# Patient Record
Sex: Female | Born: 1960 | Hispanic: No | Marital: Married | Smoking: Never smoker
Health system: Southern US, Community
[De-identification: ages and names within clinical notes are randomized; demographics above are authoritative.]

## PROBLEM LIST (undated history)

## (undated) DIAGNOSIS — A159 Respiratory tuberculosis unspecified: Secondary | ICD-10-CM

## (undated) DIAGNOSIS — I959 Hypotension, unspecified: Secondary | ICD-10-CM

## (undated) HISTORY — DX: Respiratory tuberculosis unspecified: A15.9

---

## 2011-12-07 ENCOUNTER — Other Ambulatory Visit: Payer: Self-pay | Admitting: Specialist

## 2011-12-07 ENCOUNTER — Ambulatory Visit
Admission: RE | Admit: 2011-12-07 | Discharge: 2011-12-07 | Disposition: A | Discharge status: Home / Self Care | Payer: No Typology Code available for payment source | Source: Ambulatory Visit | Attending: Specialist | Admitting: Specialist

## 2011-12-07 DIAGNOSIS — R6889 Other general symptoms and signs: Secondary | ICD-10-CM

## 2011-12-07 DIAGNOSIS — R7612 Nonspecific reaction to cell mediated immunity measurement of gamma interferon antigen response without active tuberculosis: Secondary | ICD-10-CM

## 2015-01-17 ENCOUNTER — Emergency Department (INDEPENDENT_AMBULATORY_CARE_PROVIDER_SITE_OTHER)
Admission: EM | Admit: 2015-01-17 | Discharge: 2015-01-17 | Disposition: A | Discharge status: Home / Self Care | Payer: Self-pay | Source: Home / Self Care | Attending: Family Medicine | Admitting: Family Medicine

## 2015-01-17 ENCOUNTER — Encounter (HOSPITAL_COMMUNITY): Payer: Self-pay | Admitting: Emergency Medicine

## 2015-01-17 DIAGNOSIS — S29011A Strain of muscle and tendon of front wall of thorax, initial encounter: Secondary | ICD-10-CM

## 2015-01-17 DIAGNOSIS — R059 Cough, unspecified: Secondary | ICD-10-CM

## 2015-01-17 DIAGNOSIS — R05 Cough: Secondary | ICD-10-CM

## 2015-01-17 DIAGNOSIS — IMO0001 Reserved for inherently not codable concepts without codable children: Secondary | ICD-10-CM

## 2015-01-17 DIAGNOSIS — R030 Elevated blood-pressure reading, without diagnosis of hypertension: Secondary | ICD-10-CM

## 2015-01-17 HISTORY — DX: Hypotension, unspecified: I95.9

## 2015-01-17 LAB — POCT URINALYSIS DIP (DEVICE)
Bilirubin Urine: NEGATIVE
GLUCOSE, UA: NEGATIVE mg/dL
Ketones, ur: NEGATIVE mg/dL
LEUKOCYTES UA: NEGATIVE
NITRITE: NEGATIVE
PROTEIN: NEGATIVE mg/dL
SPECIFIC GRAVITY, URINE: 1.01 (ref 1.005–1.030)
UROBILINOGEN UA: 0.2 mg/dL (ref 0.0–1.0)
pH: 7 (ref 5.0–8.0)

## 2015-01-17 MED ORDER — TRAMADOL HCL 50 MG PO TABS: 50.0000 mg | ORAL_TABLET | Freq: Two times a day (BID) | ORAL | Status: AC | PRN

## 2015-01-17 MED ORDER — IBUPROFEN 600 MG PO TABS: 600.0000 mg | ORAL_TABLET | Freq: Three times a day (TID) | ORAL | Status: AC | PRN

## 2015-01-17 MED ORDER — IBUPROFEN 800 MG PO TABS
ORAL_TABLET | ORAL | Status: AC
  Filled 2015-01-17: qty 1

## 2015-01-17 MED ORDER — IBUPROFEN 800 MG PO TABS
800.0000 mg | ORAL_TABLET | Freq: Once | ORAL | Status: AC
  Administered 2015-01-17: 800 mg via ORAL

## 2015-01-17 NOTE — Discharge Instructions (Signed)
May use Claritin or Zyrtec as directed on packaging in stead of Allegra for allergy symptoms. Medications as prescribed for chest wall pain and cough suppression. Please review instructions below. You should also be aware that your blood pressure is elevated (167/94) and this should be addressed by the primary care doctor of your choice as soon as possible. If symptoms suddenly worse or severe, seek immediate re-evaluation at your nearest emergency room Cough, Adult  A cough is a reflex that helps clear your throat and airways. It can help heal the body or may be a reaction to an irritated airway. A cough may only last 2 or 3 weeks (acute) or may last more than 8 weeks (chronic).  CAUSES Acute cough:  Viral or bacterial infections. Chronic cough:  Infections.  Allergies.  Asthma.  Post-nasal drip.  Smoking.  Heartburn or acid reflux.  Some medicines.  Chronic lung problems (COPD).  Cancer. SYMPTOMS   Cough.  Fever.  Chest pain.  Increased breathing rate.  High-pitched whistling sound when breathing (wheezing).  Colored mucus that you cough up (sputum). TREATMENT   A bacterial cough may be treated with antibiotic medicine.  A viral cough must run its course and will not respond to antibiotics.  Your caregiver may recommend other treatments if you have a chronic cough. HOME CARE INSTRUCTIONS   Only take over-the-counter or prescription medicines for pain, discomfort, or fever as directed by your caregiver. Use cough suppressants only as directed by your caregiver.  Use a cold steam vaporizer or humidifier in your bedroom or home to help loosen secretions.  Sleep in a semi-upright position if your cough is worse at night.  Rest as needed.  Stop smoking if you smoke. SEEK IMMEDIATE MEDICAL CARE IF:   You have pus in your sputum.  Your cough starts to worsen.  You cannot control your cough with suppressants and are losing sleep.  You begin coughing up  blood.  You have difficulty breathing.  You develop pain which is getting worse or is uncontrolled with medicine.  You have a fever. MAKE SURE YOU:   Understand these instructions.  Will watch your condition.  Will get help right away if you are not doing well or get worse. Document Released: 02/20/2011 Document Revised: 11/16/2011 Document Reviewed: 02/20/2011 [facility] Patient Information 2015 [place]. This information is not intended to replace advice given to you by your health care provider. Make sure you discuss any questions you have with your health care provider.

## 2015-01-17 NOTE — ED Provider Notes (Signed)
CSN: [id number]     Arrival date & time 01/17/15  0815 History   First MD Initiated Contact with Patient 01/17/15 0830     Chief Complaint  Patient presents with  . Pain   (Consider location/radiation/quality/duration/timing/severity/associated sxs/prior Treatment) HPI Comments: Patient reports that she suffered from environmental allergy symptoms for much of the month of April 2016. Tried taking Allegra with little relief. States symptoms included cough, rhinorrhea, sneezing. Cough has persisted and she has developed left lower anterior chest wall discomfort she suspects is from persistent coughing. States area is sore to the touch and also with bending or twisting of her torso. No fever/chills. No dyspnea, diaphoresis.  No hemoptysis, night sweats or weight loss. Non smoker Reported to be otherwise healthy PCP: none  The history is provided by the patient and a relative. The history is limited by a language barrier. A language interpreter was used.    Past Medical History  Diagnosis Date  . Hypotension    Past Surgical History  Procedure Laterality Date  . Cesarean section     No family history on file. History  Substance Use Topics  . Smoking status: Never Smoker   . Smokeless tobacco: Not on file  . Alcohol Use: 1.2 oz/week    2 Glasses of wine per week   OB History    No data available     Review of Systems  Constitutional: Negative for chills, fatigue and unexpected weight change.  HENT: Positive for congestion, rhinorrhea and sneezing. Negative for nosebleeds, postnasal drip, sore throat and trouble swallowing.   Eyes: Positive for itching.  Respiratory: Positive for cough. Negative for chest tightness and shortness of breath.   Cardiovascular: Positive for chest pain.       See HPI  Gastrointestinal: Negative.   Musculoskeletal: Negative for back pain.  Skin: Negative.   Neurological: Negative for dizziness, syncope and light-headedness.    Allergies  Review  of patient's allergies indicates no known allergies.  Home Medications   Prior to Admission medications   Medication Sig Start Date End Date Taking? Authorizing Provider  Fexofenadine HCl (ALLEGRA PO) Take by mouth.   Yes Historical Provider, MD  Multiple Vitamins-Minerals (CENTRUM SILVER PO) Take by mouth.   Yes Historical Provider, MD  ibuprofen (ADVIL,MOTRIN) 600 MG tablet Take 1 tablet (600 mg total) by mouth every 8 (eight) hours as needed for mild pain or moderate pain. 01/17/15   Mathis FareJennifer Lee H Eli Adami, PA  traMADol (ULTRAM) 50 MG tablet Take 1 tablet (50 mg total) by mouth every 12 (twelve) hours as needed for moderate pain or severe pain. And for cough suppression 01/17/15   Jess BartersJennifer Lee H Stratton Villwock, PA   BP 167/94 mmHg  Pulse 71  Temp(Src) 97.7 F (36.5 C) (Oral)  Resp 18  SpO2 98% Physical Exam  Constitutional: She is oriented to person, place, and time. She appears well-developed and well-nourished. No distress.  HENT:  Head: Normocephalic and atraumatic.  Right Ear: Hearing, tympanic membrane, external ear and ear canal normal.  Left Ear: Hearing, tympanic membrane, external ear and ear canal normal.  Nose: Nose normal.  Mouth/Throat: Uvula is midline, oropharynx is clear and moist and mucous membranes are normal.  Eyes: Conjunctivae are normal.  Neck: Normal range of motion. Neck supple.  Cardiovascular: Normal rate, regular rhythm and normal heart sounds.   Pulmonary/Chest: Effort normal and breath sounds normal. No respiratory distress. She exhibits tenderness. Left breast exhibits no mass, no skin change and no tenderness.  Outlined area is region of discomfort with palpation and movement. No rash or other deformity  Abdominal: Soft. Bowel sounds are normal. She exhibits no distension and no mass. There is no tenderness. There is no rebound and no guarding.  Musculoskeletal: Normal range of motion.  Neurological: She is alert and oriented to person, place, and time.    Skin: Skin is warm and dry.  Psychiatric: She has a normal mood and affect. Her behavior is normal.  Nursing note and vitals reviewed.   ED Course  Procedures (including critical care time) Labs Review Labs Reviewed  POCT URINALYSIS DIP (DEVICE) - Abnormal; Notable for the following:    Hgb urine dipstick TRACE (*)    All other components within normal limits    Imaging Review No results found.   MDM   1. Chest wall muscle strain, initial encounter   2. Cough   3. Elevated blood pressure     May use Claritin or Zyrtec as directed on packaging in stead of Allegra for allergy symptoms. Medications as prescribed for chest wall pain and cough suppression. Please review instructions below. You should also be aware that your blood pressure is elevated (167/94) and this should be addressed by the primary care doctor of your choice as soon as possible. If symptoms suddenly worse or severe, seek immediate re-evaluation at your nearest emergency room    Ria ClockJennifer Lee H Arman Loy, [place] 01/17/15 [phone]

## 2015-01-17 NOTE — ED Notes (Signed)
Pain around lower left rib cage.  Pain described as a "bruise inside".  Has had this pain for 10 days.  Pain aggravated by movement, cough.  Patient and family think related to coughing, sneezing, and runny nose that patient has been dealing with since April.

## 2015-02-15 ENCOUNTER — Ambulatory Visit (INDEPENDENT_AMBULATORY_CARE_PROVIDER_SITE_OTHER): Payer: No Typology Code available for payment source | Admitting: Family Medicine

## 2015-02-15 VITALS — BP 118/80 | HR 60 | Temp 98.4°F | Resp 12 | Ht 61.25 in | Wt 120.8 lb

## 2015-02-15 DIAGNOSIS — Z283 Underimmunization status: Secondary | ICD-10-CM | POA: Diagnosis not present

## 2015-02-15 DIAGNOSIS — Z23 Encounter for immunization: Secondary | ICD-10-CM

## 2015-02-15 DIAGNOSIS — Z2839 Other underimmunization status: Secondary | ICD-10-CM

## 2015-02-15 NOTE — Progress Notes (Signed)
  Subjective:  Patient ID: Monica Hartman, female    DOB: [birth year]  Age: 54 y.o. MRN: [id number]  54 year old lady who had a physical done elsewhere not long ago. Among the tests that were done on her she had hepatitis B serology done. She had had a vaccine about 12 years ago, but the serology came back negative. She is in here to get her hepatitis be series updated. She is otherwise healthy. They brought in the copy of her labs which are reviewed. Everything else looked pretty much in order. She is [place], does not speak much English, and her son interpreted for her.   Objective:   Exam not needed today.  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   HOPPER,DAVID, MD 02/15/2015

## 2015-02-15 NOTE — Patient Instructions (Addendum)
You will receive hepatitis B vaccine today and again in 1 month. In about December you should return for a immunity titer.  Hepatitis B Vaccine: What You Need to Know  1. What is hepatitis B? Hepatitis B is a serious infection that affects the liver. It is caused by the hepatitis B virus.   In 2009, about 38,000 people became infected with hepatitis B.  Each year about 2,000 to 4,000 people die in the [place] from cirrhosis or liver cancer caused by hepatitis B. Hepatitis B can cause:  Acute (short-term) illness. This can lead to:  loss of appetite  tiredness  pain in muscles, joints, and stomach  diarrhea and vomiting  jaundice (yellow skin or eyes) Acute illness, with symptoms, is more common among adults. Children who become infected usually do not have symptoms.  Chronic (long-term) infection. Some people go on to develop chronic hepatitis B infection. Most of them do not have symptoms, but the infection is still very serious, and can lead to:  liver damage (cirrhosis)  liver cancer  death Chronic infection is more common among infants and children than among adults. People who are chronically infected can spread hepatitis B virus to others, even if they don't look or feel sick. Up to 1.4 million people in the [place] may have chronic hepatitis B infection.  Hepatitis B virus is easily spread through contact with the blood or other body fluids of an infected person. People can also be infected from contact with a contaminated object, where the virus can live for up to 7 days.  A baby whose mother is infected can be infected at birth;  Children, adolescents, and adults can become infected by:  contact with blood and body fluids through breaks in the skin such as bites, cuts, or sores;  contact with objects that have blood or body fluids on them such as toothbrushes, razors, or monitoring and treatment devices for diabetes;  having unprotected sex with an  infected person;  sharing needles when injecting drugs;  being stuck with a used needle. 2. Hepatitis B vaccine: Why get vaccinated? Hepatitis B vaccine can prevent hepatitis B, and the serious consequences of hepatitis B infection, including liver cancer and cirrhosis. Hepatitis B vaccine may be given by itself or in the same shot with other vaccines. Routine hepatitis B vaccination was recommended for some [place] adults and children beginning in 1982, and for all children in 1991. Since 1990, new hepatitis B infections among children and adolescents have dropped by more than 95%--and by 75% in other age groups. Vaccination gives long-term protection from hepatitis B infection, possibly lifelong. 3. Who should get hepatitis B vaccine and when? Children and adolescents  Babies normally get 3 doses of hepatitis B vaccine:  1st Dose: Birth  2nd Dose: 44-61 months of age  3rd Dose: 68-74 months of age Some babies might get 4 doses, for example, if a combination vaccine containing hepatitis B is used. (This is a single shot containing several vaccines.) The extra dose is not harmful.  Anyone through 54 years of age who didn't get the vaccine when they were younger should also be vaccinated. Adults  All unvaccinated adults at risk for hepatitis B infection should be vaccinated. This includes:  sex partners of people infected with hepatitis B,  men who have sex with men,  people who inject street drugs,  people with more than one sex partner,  people with chronic liver or kidney disease,  people under 3  years of age with diabetes,  people with jobs that expose them to human blood or other body fluids,  household contacts of people infected with hepatitis B,  residents and staff in institutions for the developmentally disabled,  kidney dialysis patients,  people who travel to countries where hepatitis B is common,  people with HIV infection.  Other people may be encouraged by  their doctor to get hepatitis B vaccine; for example, adults 106 and older with diabetes. Anyone else who wants to be protected from hepatitis B infection may get the vaccine.  Pregnant women who are at risk for one of the reasons stated above should be vaccinated. Other pregnant women who want protection may be vaccinated. Adults getting hepatitis B vaccine should get 3 doses--with the second dose given 4 weeks after the first and the third dose 5 months after the second. Your doctor can tell you about other dosing schedules that might be used in certain circumstances. 4. Who should not get hepatitis B vaccine?  Anyone with a life-threatening allergy to yeast, or to any other component of the vaccine, should not get hepatitis B vaccine. Tell your doctor if you have any severe allergies.  Anyone who has had a life-threatening allergic reaction to a previous dose of hepatitis B vaccine should not get another dose.  Anyone who is moderately or severely ill when a dose of vaccine is scheduled should probably wait until they recover before getting the vaccine. Your doctor can give you more information about these precautions. Note: You might be asked to wait 28 days before donating blood after getting hepatitis B vaccine. This is because the screening test could mistake vaccine in the bloodstream (which is not infectious) for hepatitis B infection. 5. What are the risks from hepatitis B vaccine? Hepatitis B is a very safe vaccine. Most people do not have any problems with it. The vaccine contains non-infectious material, and cannot cause hepatitis B infection. Some mild problems have been reported:  Soreness where the shot was given (up to about 1 person in 4).  Temperature of 99.69F or higher (up to about 1 person in 15). Severe problems are extremely rare. Severe allergic reactions are believed to occur about once in 1.1 million doses. A vaccine, like any medicine, could cause a serious reaction.  But the risk of a vaccine causing serious harm, or death, is extremely small. More than 100 million people in the [place] have been vaccinated with hepatitis B vaccine. 6. What if there is a serious reaction? What should I look for?  Look for anything that concerns you, such as signs of a severe allergic reaction, very high fever, or behavior changes. Signs of a severe allergic reaction can include hives, swelling of the face and throat, difficulty breathing, a fast heartbeat, dizziness, and weakness. These would start a few minutes to a few hours after the vaccination. What should I do?  If you think it is a severe allergic reaction or other emergency that can't wait, call 9-1-1 or get the person to the nearest hospital. Otherwise, call your doctor.  Afterward, the reaction should be reported to the Vaccine Adverse Event Reporting System (VAERS). Your doctor might file this report, or you can do it yourself through the VAERS web site at www.vaers.LAgents.no, or by calling 1-307-094-6084. VAERS is only for reporting reactions. They do not give medical advice. 7. The National Vaccine Injury Compensation Program The [employer] Vaccine Injury Compensation Program (VICP) is a Stage manager that was  created to compensate people who may have been injured by certain vaccines. Persons who believe they may have been injured by a vaccine can learn about the program and about filing a claim by calling 1-(909)592-7930 or visiting the VICP website at SpiritualWord.at. 8. How can I learn more?  Ask your doctor.  Call your local or state health department.  Contact the Centers for Disease Control and Prevention (CDC):  Call 316 313 6608 (1-800-CDC-INFO) or  Visit CDC's website at PicCapture.uy CDC Hepatitis B Interim VIS (10/09/10) Document Released: 06/18/2006 Document Revised: 01/08/2014 Document Reviewed: 10/05/2013 [facility] Patient Information 2015 [place], [place]. This  information is not intended to replace advice given to you by your health care provider. Make sure you discuss any questions you have with your health care provider.

## 2015-03-17 ENCOUNTER — Ambulatory Visit (INDEPENDENT_AMBULATORY_CARE_PROVIDER_SITE_OTHER): Payer: No Typology Code available for payment source | Admitting: *Deleted

## 2015-03-17 DIAGNOSIS — Z23 Encounter for immunization: Secondary | ICD-10-CM

## 2015-03-17 DIAGNOSIS — Z283 Underimmunization status: Secondary | ICD-10-CM | POA: Diagnosis not present

## 2015-03-17 DIAGNOSIS — Z2839 Other underimmunization status: Secondary | ICD-10-CM

## 2015-03-17 DIAGNOSIS — Z09 Encounter for follow-up examination after completed treatment for conditions other than malignant neoplasm: Secondary | ICD-10-CM

## 2015-08-07 ENCOUNTER — Encounter: Payer: Self-pay | Admitting: Obstetrics and Gynecology

## 2015-08-07 ENCOUNTER — Ambulatory Visit (INDEPENDENT_AMBULATORY_CARE_PROVIDER_SITE_OTHER): Payer: No Typology Code available for payment source | Admitting: Obstetrics and Gynecology

## 2015-08-07 VITALS — BP 146/83 | HR 64 | Temp 97.7°F | Ht 59.0 in | Wt 126.6 lb

## 2015-08-07 DIAGNOSIS — Z124 Encounter for screening for malignant neoplasm of cervix: Secondary | ICD-10-CM

## 2015-08-07 DIAGNOSIS — Z1151 Encounter for screening for human papillomavirus (HPV): Secondary | ICD-10-CM

## 2015-08-07 DIAGNOSIS — Z1231 Encounter for screening mammogram for malignant neoplasm of breast: Secondary | ICD-10-CM

## 2015-08-07 DIAGNOSIS — Z01419 Encounter for gynecological examination (general) (routine) without abnormal findings: Secondary | ICD-10-CM

## 2015-08-07 NOTE — Progress Notes (Signed)
[employer] [phone] Pt reports having bleeding x two days and had to wear a panty liner.  Mammogram appt scheduled for 08/28/15 @ 1130

## 2015-08-07 NOTE — Progress Notes (Signed)
  Subjective:     Monica Hartman is a 54 y.o. female G2P2 who is here for a comprehensive physical exam. The patient reports no problems. She is not sexually active. She denies any urinary incontinence. She continues to have a menstrual cycle lasting 3 days. She reports that her cycles are irregular and scares but only last 3 days per month. Her last one being this month  Past Medical History  Diagnosis Date  . Hypotension   . Tuberculosis    Past Surgical History  Procedure Laterality Date  . Cesarean section     Family History  Problem Relation Age of Onset  . Hypertension Mother   . Cancer Father    Social History   Social History  . Marital Status: Married    Spouse Name: N/A  . Number of Children: N/A  . Years of Education: N/A   Occupational History  . Not on file.   Social History Main Topics  . Smoking status: Never Smoker   . Smokeless tobacco: Not on file  . Alcohol Use: 1.2 oz/week    2 Glasses of wine per week  . Drug Use: No  . Sexual Activity: Not on file   Other Topics Concern  . Not on file   Social History Narrative   Health Maintenance  Topic Date Due  . Hepatitis C Screening  [birth year]  . HIV Screening  03/15/1976  . TETANUS/TDAP  03/15/1980  . PAP SMEAR  03/15/1982  . MAMMOGRAM  03/16/2011  . COLONOSCOPY  03/16/2011  . INFLUENZA VACCINE  04/08/2015       Review of Systems Pertinent items are noted in HPI.   Objective:      GENERAL: Well-developed, well-nourished female in no acute distress.  HEENT: Normocephalic, atraumatic. Sclerae anicteric.  NECK: Supple. Normal thyroid.  LUNGS: Clear to auscultation bilaterally.  HEART: Regular rate and rhythm. BREASTS: Symmetric in size. No palpable masses or lymphadenopathy, skin changes, or nipple drainage. ABDOMEN: Soft, nontender, nondistended. No organomegaly. PELVIC: Normal external female genitalia. Vagina is pink and rugated.  Normal discharge. Normal appearing cervix. Uterus is normal  in size. No adnexal mass or tenderness. EXTREMITIES: No cyanosis, clubbing, or edema, 2+ distal pulses.    Assessment:    Healthy female exam.      Plan:    pap smear collected Screening mammogram ordered Patient advised to perform monthly self breast and vulva exam Pelvic ultrasound ordered to assess endometrium Patient advised to monitor vaginal bleeding and to return if there is a change in her cycle Patient will be contacted with any abnormal results  See After Visit Summary for Counseling Recommendations

## 2015-08-09 LAB — CYTOLOGY - PAP

## 2015-08-17 ENCOUNTER — Ambulatory Visit: Payer: No Typology Code available for payment source

## 2015-08-17 DIAGNOSIS — Z23 Encounter for immunization: Secondary | ICD-10-CM | POA: Diagnosis not present

## 2015-08-28 ENCOUNTER — Ambulatory Visit
Admission: RE | Admit: 2015-08-28 | Discharge: 2015-08-28 | Disposition: A | Discharge status: Home / Self Care | Payer: No Typology Code available for payment source | Source: Ambulatory Visit | Attending: Obstetrics and Gynecology | Admitting: Obstetrics and Gynecology

## 2015-08-28 ENCOUNTER — Ambulatory Visit (HOSPITAL_COMMUNITY)
Admission: RE | Admit: 2015-08-28 | Discharge: 2015-08-28 | Disposition: A | Discharge status: Home / Self Care | Payer: No Typology Code available for payment source | Source: Ambulatory Visit | Attending: Obstetrics and Gynecology | Admitting: Obstetrics and Gynecology

## 2015-08-28 ENCOUNTER — Other Ambulatory Visit: Payer: Self-pay | Admitting: Obstetrics and Gynecology

## 2015-08-28 DIAGNOSIS — N938 Other specified abnormal uterine and vaginal bleeding: Secondary | ICD-10-CM | POA: Diagnosis present

## 2015-08-28 DIAGNOSIS — N926 Irregular menstruation, unspecified: Secondary | ICD-10-CM | POA: Diagnosis not present

## 2015-08-28 DIAGNOSIS — Z1231 Encounter for screening mammogram for malignant neoplasm of breast: Secondary | ICD-10-CM

## 2015-08-28 DIAGNOSIS — Z01419 Encounter for gynecological examination (general) (routine) without abnormal findings: Secondary | ICD-10-CM

## 2015-08-29 ENCOUNTER — Telehealth: Payer: Self-pay

## 2015-08-29 NOTE — Telephone Encounter (Signed)
Left message for patient to call us back in regards to results.

## 2015-08-29 NOTE — Telephone Encounter (Signed)
-----   Message from Catalina AntiguaPeggy Constant, MD sent at 08/29/2015  8:25 AM EST ----- Please inform the patient of normal ultrasound. She should keep a menstrual calendar. If bleeding persists, she should come in for endometrial biopsy  Peggy

## 2015-08-29 NOTE — Telephone Encounter (Signed)
Laural BenesMike Kim, son of patient returned Dee's call regarding results. Son interpreted for mom we informed them of normal ultrasound results and also future medical advice in terms of her bleeding. If patients bleeding persist she needs to call us back to make her an appt for an Endo biopsy. Patient understood will.

## 2015-09-03 ENCOUNTER — Telehealth: Payer: Self-pay | Admitting: *Deleted

## 2015-09-03 NOTE — Telephone Encounter (Signed)
Per Dr Jolayne Pantheronstant please inform patient of normal ultrasound, she should keep a menstrual calendar and follow up for endometrial biopsy. Called patient with [employer] [place] [employer]. Unable to reach patient and voicemail is full. Will try again at another time.

## 2015-09-12 ENCOUNTER — Encounter: Payer: Self-pay | Admitting: *Deleted

## 2015-09-12 NOTE — Telephone Encounter (Addendum)
Attempted again to call patient using [employer] [place] [employer]. Patient did not answer and the mailbox is full. Will send letter. Letter sent.

## 2016-10-17 IMAGING — US US TRANSVAGINAL NON-OB
1 series · 15 of 25 positions shown · non-contrast
Comparison: None in PACs

CLINICAL DATA: Dysfunctional uterine bleeding ; short irregular
periods, N0. For 1 year from May 2014 to May 2015.



[Series 1: us transvaginal non-ob · 15 of 76 slices shown]
[im 1/76]
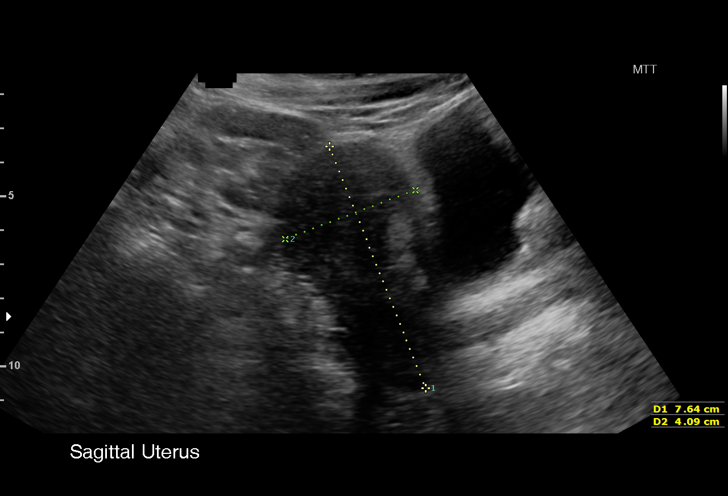
[im 7/76]
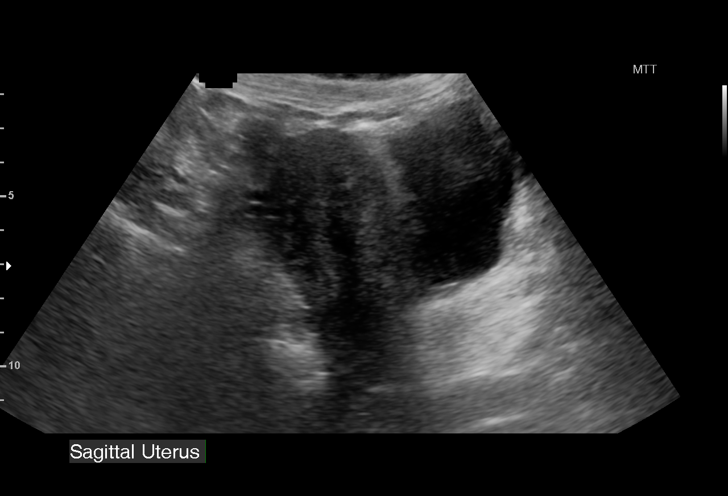
[im 13/76]
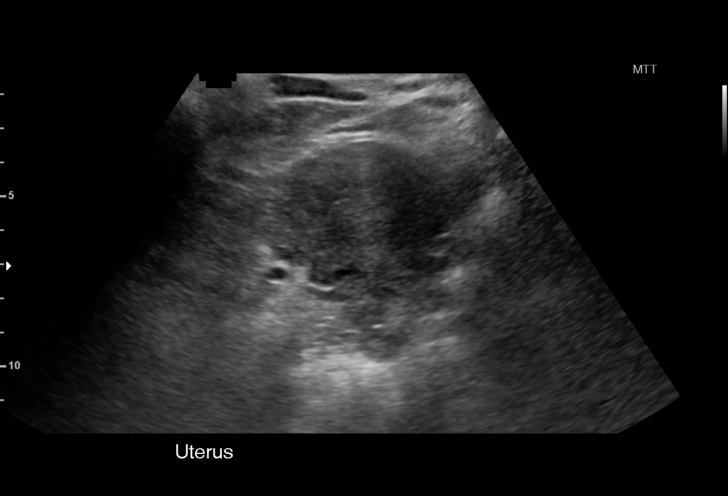
[im 16/76]
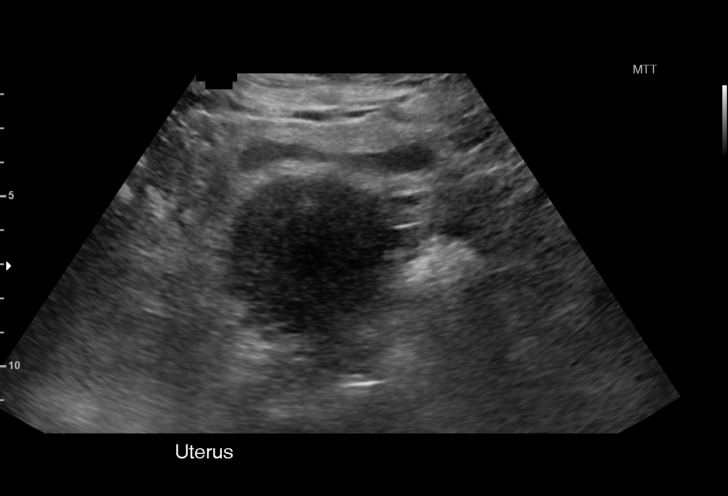
[im 22/76]
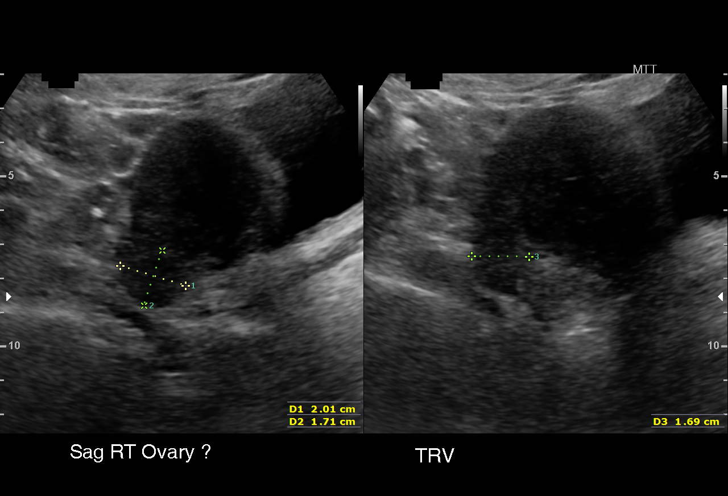
[im 29/76]
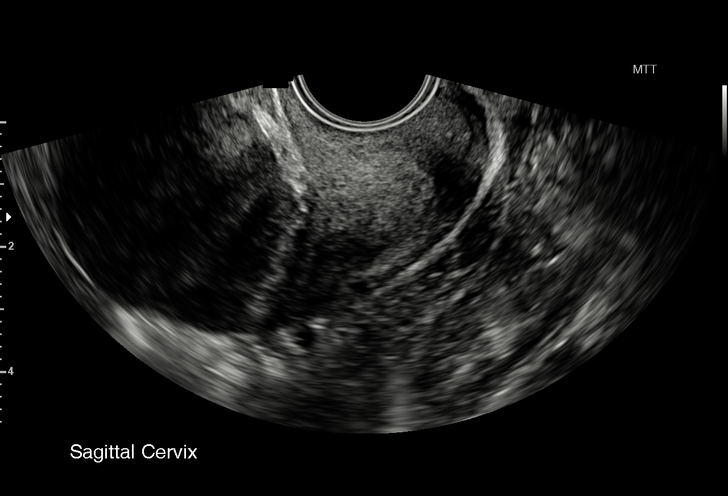
[im 32/76]
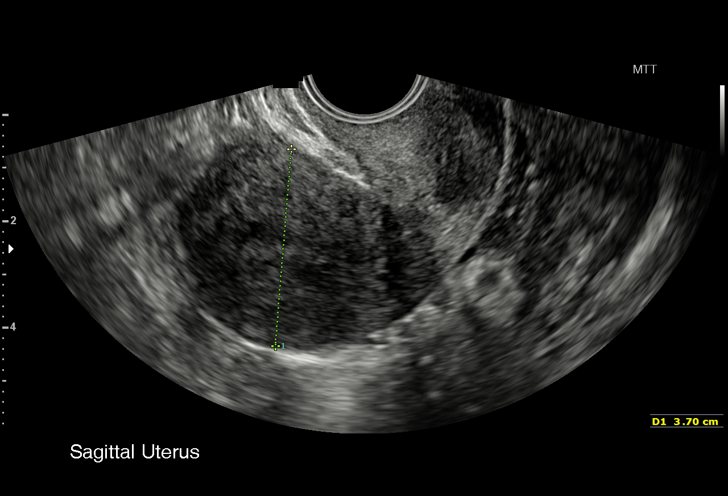
[im 38/76]
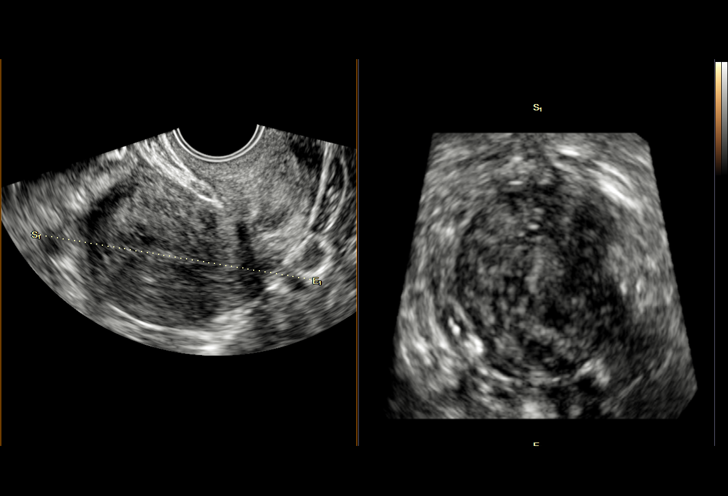
[im 44/76]
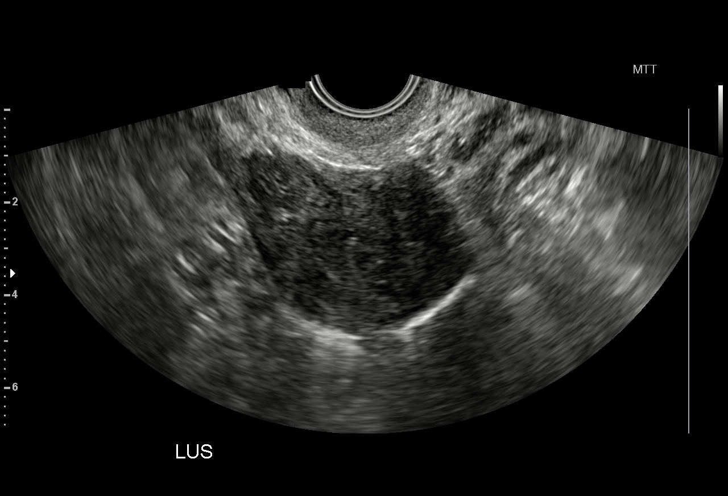
[im 47/76]
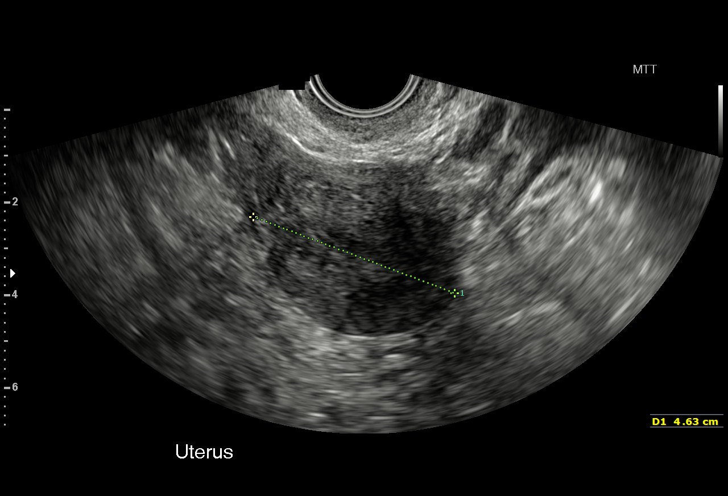
[im 54/76]
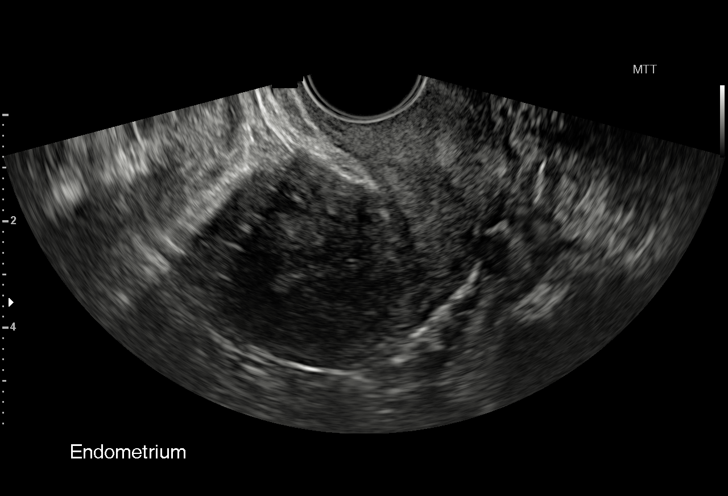
[im 60/76]
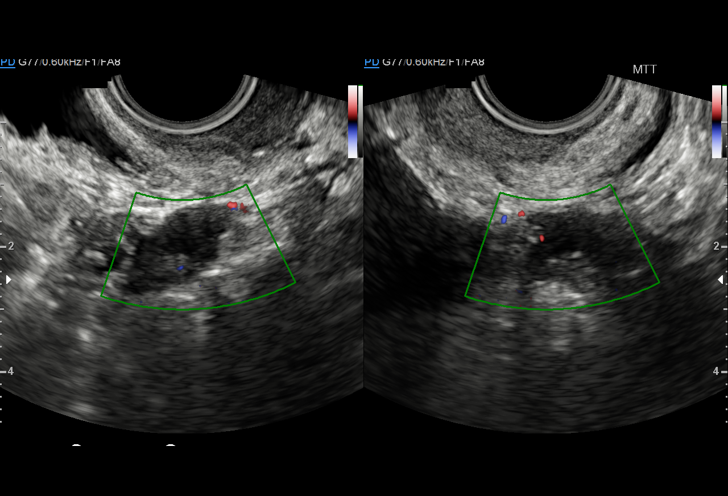
[im 63/76]
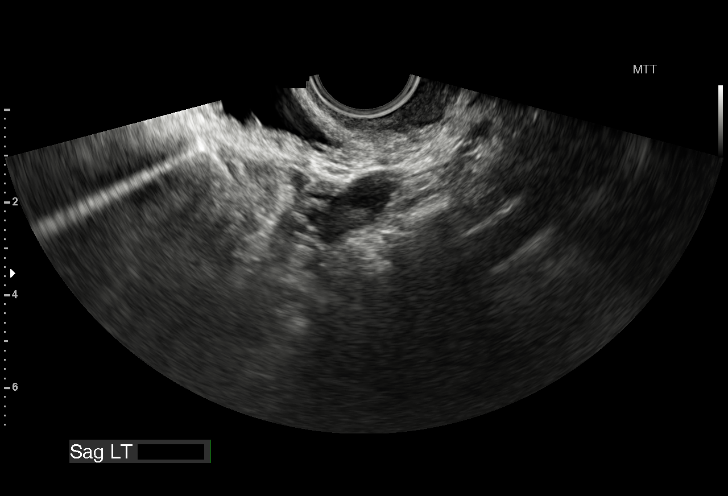
[im 69/76]
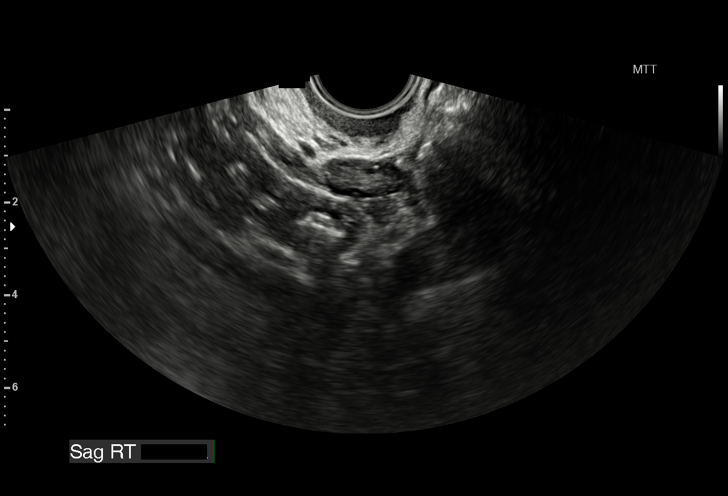
[im 76/76]
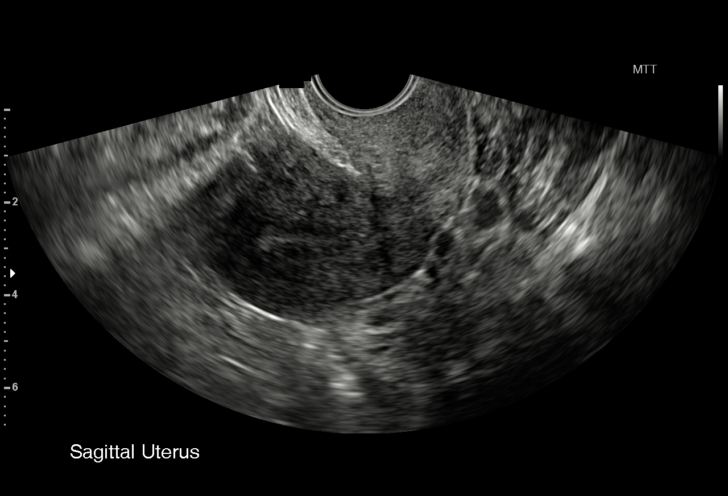

[15 of 25 positions shown; findings below may reference images not displayed]

FINDINGS: Uterus

Measurements: 7.5 x 3.7 x 4.6 cm. The echotexture is heterogeneous.
There is no discrete mass.

Endometrium

Thickness: 4.0 mm. The endometrium was difficult to follow
throughout its course. No endometrial masses are observed.

Right ovary

Measurements: 1.7 x 0.7 x 2 cm. No developing follicles observed. No
adnexal masses are demonstrated.

Left ovary

Measurements: 2.0 x 1.2 x 1.9 cm. Thereis at least 1 developing
follicle.

Other findings

There is no free pelvic fluid.
IMPRESSION: 1. Normal size and contour of the uterus without definite fibroids.
The endometrial stripe is not abnormally thickened. If bleeding
remains unresponsive to hormonal or medical therapy, sonohysterogram
should be considered for focal lesion work-up. (Ref: Radiological
Reasoning: Algorithmic Workup of Abnormal Vaginal Bleeding with
Endovaginal Sonography and Sonohysterography. AJR [id number]; 191:S68-73)
2. No acute abnormalities of the ovaries. No adnexal masses or free
fluid observed.

## 2016-10-17 IMAGING — MG MM SCREEN MAMMOGRAM BILATERAL
5 series · 5 of 5 positions shown · non-contrast
Comparison: None.

CLINICAL DATA: Screening.

EXAM:
DIGITAL SCREENING BILATERAL MAMMOGRAM WITH CAD

[R CC (1 of 2)]
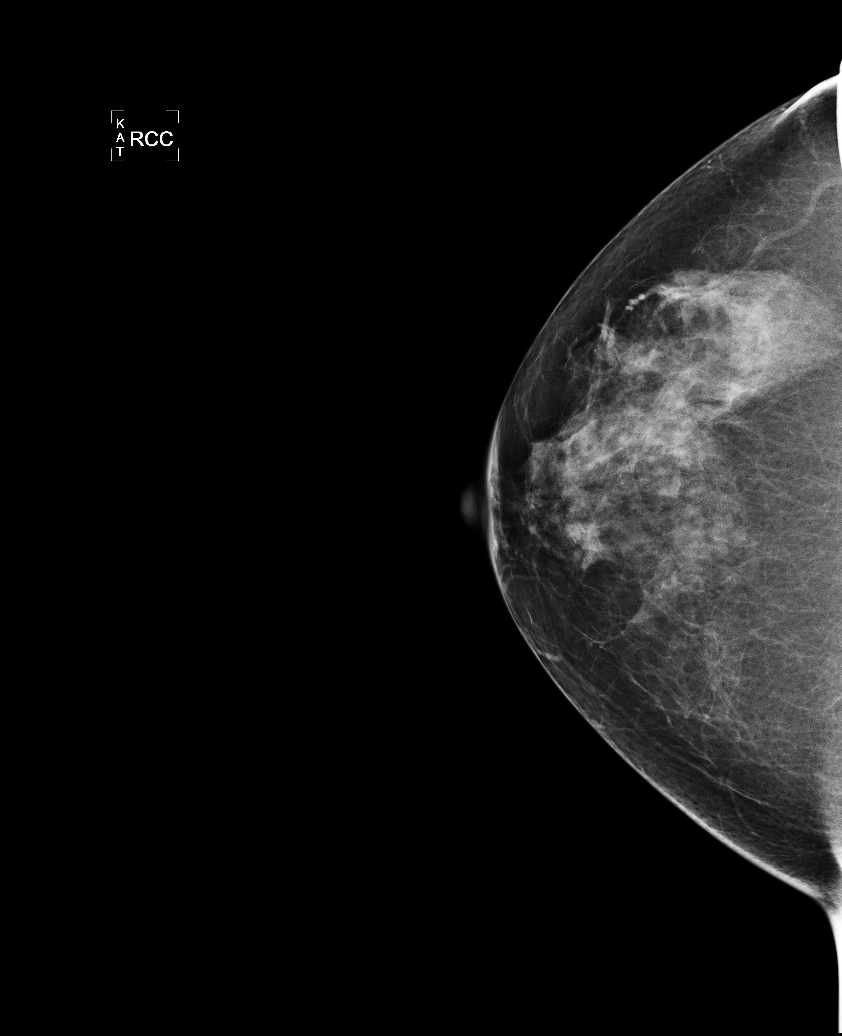

[L CC]
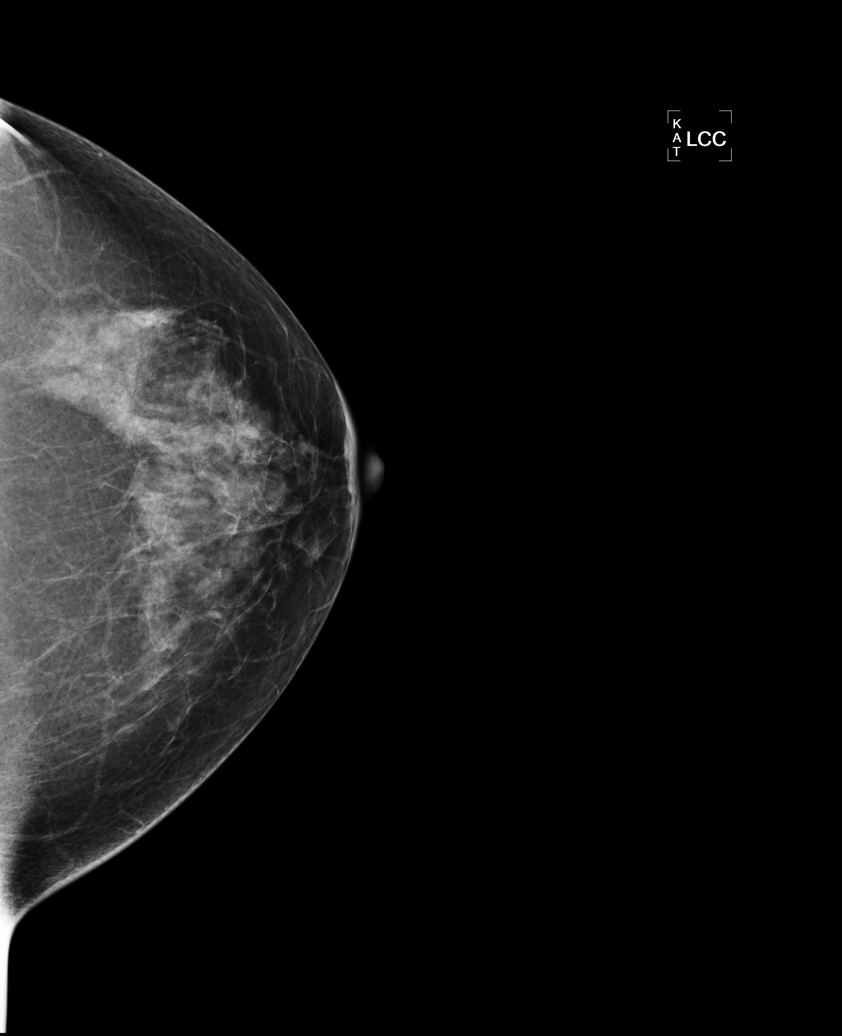

[L MLO]
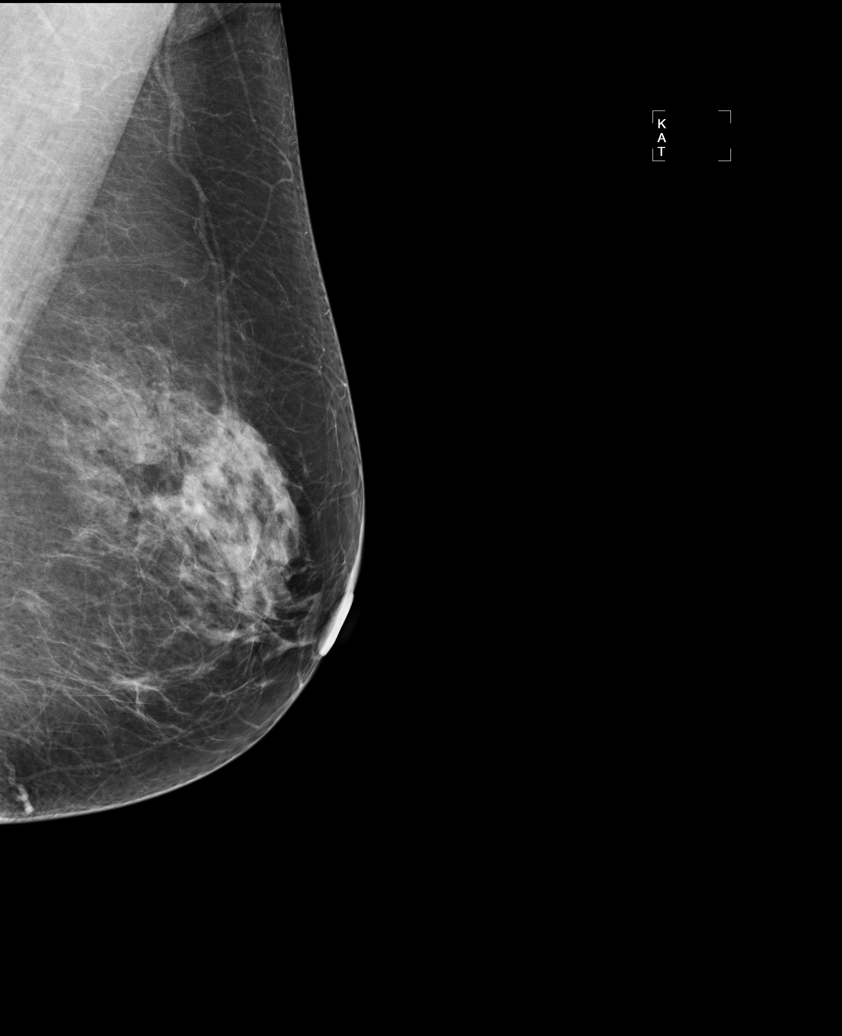

[R MLO]
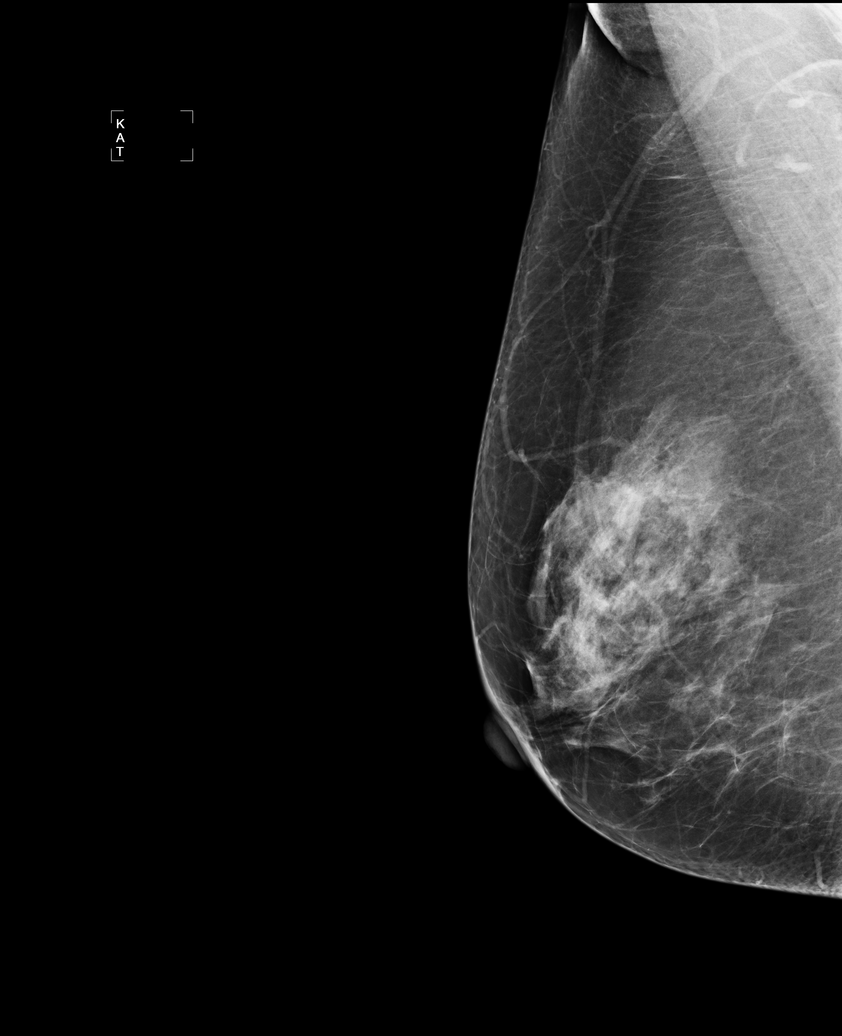

[R CC (2 of 2)]
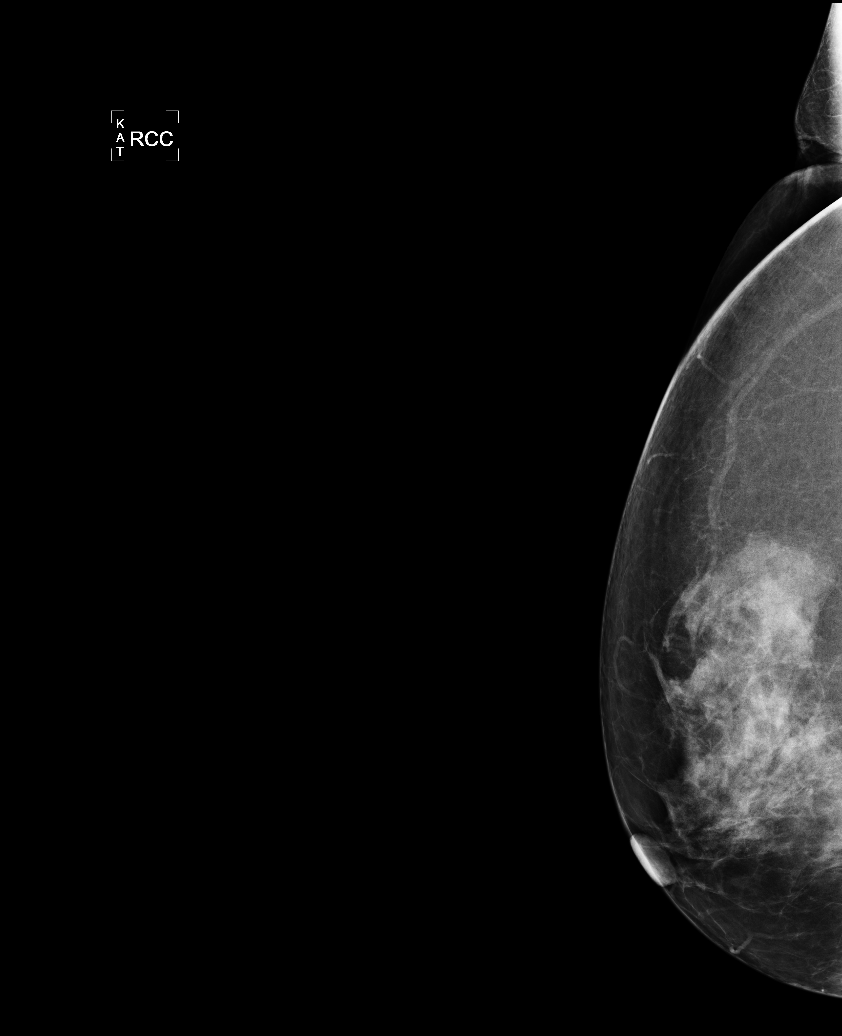

[5 of 5 positions shown; findings below may reference images not displayed]

ACR Breast Density Category b: There are scattered areas of
fibroglandular density.
FINDINGS: There are no findings suspicious for malignancy. Images were
processed with CAD.
IMPRESSION: No mammographic evidence of malignancy. A result letter of this
screening mammogram will be mailed directly to the patient.

RECOMMENDATION:
Screening mammogram in one year. (Code:[id number]-[id number])

BI-RADS CATEGORY  1: Negative.
